# Patient Record
Sex: Male | Born: 1995 | Race: Black or African American | Hispanic: No | Marital: Single | Smoking: Never smoker
Health system: Southern US, Community
[De-identification: ages and names within clinical notes are randomized; demographics above are authoritative.]

---

## 2017-09-23 ENCOUNTER — Ambulatory Visit (HOSPITAL_COMMUNITY)
Admission: EM | Admit: 2017-09-23 | Discharge: 2017-09-23 | Disposition: A | Discharge status: Home / Self Care | Payer: Self-pay | Attending: Family Medicine | Admitting: Family Medicine

## 2017-09-23 ENCOUNTER — Ambulatory Visit (HOSPITAL_COMMUNITY)
Admission: EM | Admit: 2017-09-23 | Discharge: 2017-09-23 | Disposition: A | Discharge status: Other Acute Inpatient Hospital | Payer: Self-pay | Attending: Family Medicine | Admitting: Family Medicine

## 2017-09-23 ENCOUNTER — Encounter (HOSPITAL_COMMUNITY): Payer: Self-pay | Admitting: Emergency Medicine

## 2017-09-23 DIAGNOSIS — N509 Disorder of male genital organs, unspecified: Secondary | ICD-10-CM

## 2017-09-23 DIAGNOSIS — N5089 Other specified disorders of the male genital organs: Secondary | ICD-10-CM

## 2017-09-23 NOTE — ED Triage Notes (Signed)
Pt states "I found a lump on my testicle and I dont know what it is". Denies pain.

## 2017-09-23 NOTE — ED Provider Notes (Signed)
[facility]    CSN: [id number] Arrival date & time: 09/23/17  1916     History   Chief Complaint Chief Complaint  Patient presents with  . Mass  . Testicular Mass    HPI Danny Moore is a 22 y.o. male.   22 year old male, presenting today complaining of a lump on his right testicle.  States that he noticed that today for the first time.  He denies any associated pain.  He denies any dysuria or hematuria.  Denies any penile discharge or lesions.   The history is provided by the patient.  Testicle Pain  This is a new problem. The current episode started 1 to 2 hours ago. The problem occurs constantly. The problem has not changed since onset.Pertinent negatives include no chest pain, no abdominal pain, no headaches and no shortness of breath. Nothing aggravates the symptoms. Nothing relieves the symptoms. He has tried nothing for the symptoms. The treatment provided no relief.    History reviewed. No pertinent past medical history.  There are no active problems to display for this patient.   History reviewed. No pertinent surgical history.     Home Medications    Prior to Admission medications   Not on File    Family History No family history on file.  Social History Social History   Tobacco Use  . Smoking status: Not on file  Substance Use Topics  . Alcohol use: Not on file  . Drug use: Not on file     Allergies   Patient has no known allergies.   Review of Systems Review of Systems  Constitutional: Negative for chills and fever.  HENT: Negative for ear pain and sore throat.   Eyes: Negative for pain and visual disturbance.  Respiratory: Negative for cough and shortness of breath.   Cardiovascular: Negative for chest pain and palpitations.  Gastrointestinal: Negative for abdominal pain and vomiting.  Genitourinary: Positive for scrotal swelling. Negative for dysuria, hematuria and testicular pain.  Musculoskeletal: Negative for  arthralgias and back pain.  Skin: Negative for color change and rash.  Neurological: Negative for seizures, syncope and headaches.  All other systems reviewed and are negative.    Physical Exam Triage Vital Signs ED Triage Vitals [09/23/17 2011]  Enc Vitals Group     BP 126/83     Pulse Rate 83     Resp 18     Temp 98.2 F (36.8 C)     Temp src      SpO2 100 %     Weight      Height      Head Circumference      Peak Flow      Pain Score      Pain Loc      Pain Edu?      Excl. in GC?    No data found.  Updated Vital Signs BP 126/83   Pulse 83   Temp 98.2 F (36.8 C)   Resp 18   SpO2 100%   Visual Acuity Right Eye Distance:   Left Eye Distance:   Bilateral Distance:    Right Eye Near:   Left Eye Near:    Bilateral Near:     Physical Exam  Constitutional: He appears well-developed and well-nourished.  HENT:  Head: Normocephalic and atraumatic.  Eyes: Conjunctivae are normal.  Neck: Neck supple.  Cardiovascular: Normal rate and regular rhythm.  No murmur heard. Pulmonary/Chest: Effort normal and breath sounds normal. No respiratory distress.  Abdominal: Soft. There is no tenderness.  Genitourinary: Right testis shows mass.  Genitourinary Comments: Small round firm mass noted superior to the right testicle in the location of the epididymis.  There is no erythema or edema of the scrotum.  Musculoskeletal: He exhibits no edema.  Neurological: He is alert.  Skin: Skin is warm and dry.  Psychiatric: He has a normal mood and affect.  Nursing note and vitals reviewed.    UC Treatments / Results  Labs (all labs ordered are listed, but only abnormal results are displayed) Labs Reviewed - No data to display  EKG  EKG Interpretation None       Radiology No results found.  Procedures Procedures (including critical care time)  Medications Ordered in UC Medications - No data to display   Initial Impression / Assessment and Plan / UC Course  I  have reviewed the triage vital signs and the nursing notes.  Pertinent labs & imaging results that were available during my care of the patient were reviewed by me and considered in my medical decision making (see chart for details).     Patient palpated a mass on his right testicle today.  Exam seems consistent with the epididymal head.  However, patient has never noticed this in the past.  Will refer to urology for ultrasound and ongoing management and evaluate Do not suspect epididymitis. He has no pain Final Clinical Impressions(s) / UC Diagnoses   Final diagnoses:  Testicular mass    ED Discharge Orders    None       Controlled Substance Prescriptions [place] Controlled Substance Registry consulted? Not Applicable   Alecia Lemming, [place] 09/23/17 2021

## 2019-09-26 ENCOUNTER — Encounter: Payer: Self-pay | Admitting: Adult Health

## 2019-09-26 ENCOUNTER — Ambulatory Visit (INDEPENDENT_AMBULATORY_CARE_PROVIDER_SITE_OTHER): Admitting: Adult Health

## 2019-09-26 ENCOUNTER — Other Ambulatory Visit: Payer: Self-pay

## 2019-09-26 ENCOUNTER — Encounter (INDEPENDENT_AMBULATORY_CARE_PROVIDER_SITE_OTHER): Payer: Self-pay

## 2019-09-26 VITALS — BP 136/78 | HR 85 | Ht 78.0 in | Wt 220.0 lb

## 2019-09-26 DIAGNOSIS — F909 Attention-deficit hyperactivity disorder, unspecified type: Secondary | ICD-10-CM | POA: Insufficient documentation

## 2019-09-26 DIAGNOSIS — F902 Attention-deficit hyperactivity disorder, combined type: Secondary | ICD-10-CM

## 2019-09-26 MED ORDER — AMPHETAMINE-DEXTROAMPHET ER 30 MG PO CP24: 30.0000 mg | ORAL_CAPSULE | Freq: Every day | ORAL | 0 refills | Status: DC

## 2019-09-26 NOTE — Progress Notes (Signed)
[facility] MD/PA/NP Initial Note  09/26/2019 4:30 PM Danny Moore  MRN:  [id number]  Chief Complaint:  Chief Complaint    ADHD      HPI:   Describes mood today as "ok". Pleasant. Mood symptoms - denies depression, anxiety, and irritability. Stating "I'm doing pretty good". History of ADHD - has been off of medication for two years and feels like he needs to restart it. Stable interest and motivation. Taking medications as prescribed.  Energy levels stable. Active, does not have a regular exercise routine. Plays Basketball. Works part-time - Visual merchandiser.  Enjoys some usual interests and activities. Single. Dating. Parents live in [place]. Spending time with friends. Appetite adequate. Weight gain - "a little".  Sleeps well most nights. Averages 6 to 7 hours. Sleep schedule "off" lately. Staying up late doing homework.  Focus and concentration difficulties. Previously diagnosed with ADHD in elementary school. Took medication up until 2 years ago . Junior at [employer]. Completing tasks. Managing aspects of household. Difficulties in school setting.  Denies SI or HI. Denies AH or VH.  Previous medication trials: Adderall XR 30mg   Visit Diagnosis:    ICD-10-CM   1. Attention deficit hyperactivity disorder (ADHD), combined type  F90.2 amphetamine-dextroamphetamine (ADDERALL XR) 30 MG 24 hr capsule    Past Psychiatric History: Denies psychiatric hospitalization.  Past Medical History: No past medical history on file. No past surgical history on file.  Family Psychiatric History: Denies any family history of mental illness.  Family History: No family history on file.  Social History:  Social History   Socioeconomic History  . Marital status: Single    Spouse name: Not on file  . Number of children: Not on file  . Years of education: Not on file  . Highest education level: Not on file  Occupational History  . Not on file  Tobacco Use  . Smoking status: Never Smoker  . Smokeless  tobacco: Never Used  Substance and Sexual Activity  . Alcohol use: Yes    Comment: Occasionally  . Drug use: Never  . Sexual activity: Not on file  Other Topics Concern  . Not on file  Social History Narrative  . Not on file   Social Determinants of Health   Financial Resource Strain:   . Difficulty of Paying Living Expenses: Not on file  Food Insecurity:   . Worried About Charity fundraiser in the Last Year: Not on file  . Ran Out of Food in the Last Year: Not on file  Transportation Needs:   . Lack of Transportation (Medical): Not on file  . Lack of Transportation (Non-Medical): Not on file  Physical Activity:   . Days of Exercise per Week: Not on file  . Minutes of Exercise per Session: Not on file  Stress:   . Feeling of Stress : Not on file  Social Connections:   . Frequency of Communication with Friends and Family: Not on file  . Frequency of Social Gatherings with Friends and Family: Not on file  . Attends Religious Services: Not on file  . Active Member of Clubs or Organizations: Not on file  . Attends Archivist Meetings: Not on file  . Marital Status: Not on file    Allergies: No Known Allergies  Metabolic Disorder Labs: No results found for: HGBA1C, MPG No results found for: PROLACTIN No results found for: CHOL, TRIG, HDL, CHOLHDL, VLDL, LDLCALC No results found for: TSH  Therapeutic Level Labs: No results found for:  LITHIUM No results found for: VALPROATE No components found for:  CBMZ  Current Medications: Current Outpatient Medications  Medication Sig Dispense Refill  . amphetamine-dextroamphetamine (ADDERALL XR) 30 MG 24 hr capsule Take 1 capsule (30 mg total) by mouth daily. 30 capsule 0   No current facility-administered medications for this visit.    Medication Side Effects: none  Orders placed this visit:  No orders of the defined types were placed in this encounter.   Psychiatric Specialty Exam:  Review of Systems  Blood  pressure 136/78, pulse 85, height 6\' 6"  (1.981 m), weight 220 lb (99.8 kg).Body mass index is 25.42 kg/m.  General Appearance: Neat and Well Groomed  Eye Contact:  Good  Speech:  Clear and Coherent and Normal Rate  Volume:  Normal  Mood:  Euthymic  Affect:  Appropriate and Congruent  Thought Process:  Coherent and Descriptions of Associations: Intact  Orientation:  Full (Time, Place, and Person)  Thought Content: Logical   Suicidal Thoughts:  No  Homicidal Thoughts:  No  Memory:  WNL  Judgement:  Good  Insight:  Good  Psychomotor Activity:  Normal  Concentration:  Concentration: Good  Recall:  Good  Fund of Knowledge: Good  Language: Good  Assets:  Communication Skills Desire for Improvement Financial Resources/Insurance Housing Intimacy Leisure Time Physical Health Resilience Social Support Talents/Skills Transportation Vocational/Educational  ADL's:  Intact  Cognition: WNL  Prognosis:  Good   Screenings: None  Receiving Psychotherapy: No   Treatment Plan/Recommendations:   Plan:  PDMP reviewed  1. Add Adderall XR 30mg  daily - previous dose  RTC 4 weeks  Patient advised to contact office with any questions, adverse effects, or acute worsening in signs and symptoms.  Discussed potential benefits, risks, and side effects of stimulants with patient to include increased heart rate, palpitations, insomnia, increased anxiety, increased irritability, or decreased appetite.  Instructed patient to contact office if experiencing any significant tolerability issues.      , NP

## 2019-10-24 ENCOUNTER — Ambulatory Visit (INDEPENDENT_AMBULATORY_CARE_PROVIDER_SITE_OTHER): Admitting: Adult Health

## 2019-10-24 ENCOUNTER — Other Ambulatory Visit: Payer: Self-pay

## 2019-10-24 ENCOUNTER — Encounter: Payer: Self-pay | Admitting: Adult Health

## 2019-10-24 DIAGNOSIS — F902 Attention-deficit hyperactivity disorder, combined type: Secondary | ICD-10-CM | POA: Diagnosis not present

## 2019-10-24 MED ORDER — AMPHETAMINE-DEXTROAMPHET ER 30 MG PO CP24: 30.0000 mg | ORAL_CAPSULE | Freq: Two times a day (BID) | ORAL | 0 refills | Status: DC

## 2019-10-24 NOTE — Progress Notes (Signed)
Danny Moore [id number] 04-30-1996 24 y.o.  Subjective:   Patient ID:  Danny Moore is a 24 y.o. (DOB [birth year]) male.  Chief Complaint: No chief complaint on file.   HPI Danny Moore presents to the office today for follow-up of ADHD.  Describes mood today as "ok". Pleasant. Mood symptoms - denies depression, anxiety, and irritability. Stating "I'm doing alright". Feels like restarting Adderall has been helpful. Getting caught up on classwork. Stating "I feel better about that". Would like to increase dose - "not lasting throughout the day". Stable interest and motivation. Taking medications as prescribed.  Energy levels stable. Active, has a regular exercise routine. Plays Basketball. Works part-time - Visual merchandiser.  Enjoys some usual interests and activities. Single. Dating. Parents live in [place]. Spending time with friends. Appetite adequate. Weight stable.   Sleeps well most nights. Averages 6 to 7 hours - "getting back to normal". Focus and concentration difficulties. Previously diagnosed with ADHD in elementary school. Took medication up until 2 years ago . Junior at [employer]. Completing tasks. Managing aspects of household. Doing better in school setting.  Denies SI or HI. Denies AH or VH.  Previous medication trials: Adderall XR 30mg   Review of Systems:  Review of Systems  Musculoskeletal: Negative for gait problem.  Neurological: Negative for tremors.  Psychiatric/Behavioral:       Please refer to HPI    Medications: I have reviewed the patient's current medications.  Current Outpatient Medications  Medication Sig Dispense Refill  . amphetamine-dextroamphetamine (ADDERALL XR) 30 MG 24 hr capsule Take 1 capsule (30 mg total) by mouth daily. 30 capsule 0   No current facility-administered medications for this visit.    Medication Side Effects: None  Allergies: No Known Allergies  No past medical history on file.  No family history on file.  Social History    Socioeconomic History  . Marital status: Single    Spouse name: Not on file  . Number of children: Not on file  . Years of education: Not on file  . Highest education level: Not on file  Occupational History  . Not on file  Tobacco Use  . Smoking status: Never Smoker  . Smokeless tobacco: Never Used  Substance and Sexual Activity  . Alcohol use: Yes    Comment: Occasionally  . Drug use: Never  . Sexual activity: Not on file  Other Topics Concern  . Not on file  Social History Narrative  . Not on file   Social Determinants of Health   Financial Resource Strain:   . Difficulty of Paying Living Expenses: Not on file  Food Insecurity:   . Worried About Charity fundraiser in the Last Year: Not on file  . Ran Out of Food in the Last Year: Not on file  Transportation Needs:   . Lack of Transportation (Medical): Not on file  . Lack of Transportation (Non-Medical): Not on file  Physical Activity:   . Days of Exercise per Week: Not on file  . Minutes of Exercise per Session: Not on file  Stress:   . Feeling of Stress : Not on file  Social Connections:   . Frequency of Communication with Friends and Family: Not on file  . Frequency of Social Gatherings with Friends and Family: Not on file  . Attends Religious Services: Not on file  . Active Member of Clubs or Organizations: Not on file  . Attends Archivist Meetings: Not on file  . Marital Status: Not on  file  Intimate Partner Violence:   . Fear of Current or Ex-Partner: Not on file  . Emotionally Abused: Not on file  . Physically Abused: Not on file  . Sexually Abused: Not on file    Past Medical History, Surgical history, Social history, and Family history were reviewed and updated as appropriate.   Please see review of systems for further details on the patient's review from today.   Objective:   Physical Exam:  There were no vitals taken for this visit.  Physical Exam Constitutional:      General:  He is not in acute distress. Musculoskeletal:        General: No deformity.  Neurological:     Mental Status: He is alert and oriented to person, place, and time.     Coordination: Coordination normal.  Psychiatric:        Attention and Perception: Attention and perception normal. He does not perceive auditory or visual hallucinations.        Mood and Affect: Mood normal. Mood is not anxious or depressed. Affect is not labile, blunt, angry or inappropriate.        Speech: Speech normal.        Behavior: Behavior normal.        Thought Content: Thought content normal. Thought content is not paranoid or delusional. Thought content does not include homicidal or suicidal ideation. Thought content does not include homicidal or suicidal plan.        Cognition and Memory: Cognition and memory normal.        Judgment: Judgment normal.     Comments: Insight intact     Lab Review:  No results found for: NA, K, CL, CO2, GLUCOSE, BUN, CREATININE, CALCIUM, PROT, ALBUMIN, AST, ALT, ALKPHOS, BILITOT, GFRNONAA, GFRAA  No results found for: WBC, RBC, HGB, HCT, PLT, MCV, MCH, MCHC, RDW, LYMPHSABS, MONOABS, EOSABS, BASOSABS  No results found for: POCLITH, LITHIUM   No results found for: PHENYTOIN, PHENOBARB, VALPROATE, CBMZ   .res Assessment: Plan:    Plan:  PDMP reviewed  1. Increase Adderall XR 30mg  daily to BID   RTC 4 weeks  Patient advised to contact office with any questions, adverse effects, or acute worsening in signs and symptoms.  Discussed potential benefits, risks, and side effects of stimulants with patient to include increased heart rate, palpitations, insomnia, increased anxiety, increased irritability, or decreased appetite.  Instructed patient to contact office if experiencing any significant tolerability issues.    There are no diagnoses linked to this encounter.   Please see After Visit Summary for patient specific instructions.  No future appointments.  No orders of  the defined types were placed in this encounter.   -------------------------------

## 2019-11-21 ENCOUNTER — Ambulatory Visit: Admitting: Adult Health

## 2019-11-28 ENCOUNTER — Emergency Department (HOSPITAL_COMMUNITY): Payer: No Typology Code available for payment source

## 2019-11-28 ENCOUNTER — Encounter (HOSPITAL_COMMUNITY): Payer: Self-pay | Admitting: *Deleted

## 2019-11-28 ENCOUNTER — Emergency Department (HOSPITAL_COMMUNITY)
Admission: EM | Admit: 2019-11-28 | Discharge: 2019-11-28 | Disposition: A | Discharge status: Home / Self Care | Payer: No Typology Code available for payment source | Attending: Emergency Medicine | Admitting: Emergency Medicine

## 2019-11-28 ENCOUNTER — Other Ambulatory Visit: Payer: Self-pay

## 2019-11-28 DIAGNOSIS — Y92410 Unspecified street and highway as the place of occurrence of the external cause: Secondary | ICD-10-CM | POA: Diagnosis not present

## 2019-11-28 DIAGNOSIS — S4991XA Unspecified injury of right shoulder and upper arm, initial encounter: Secondary | ICD-10-CM

## 2019-11-28 DIAGNOSIS — Z79899 Other long term (current) drug therapy: Secondary | ICD-10-CM | POA: Diagnosis not present

## 2019-11-28 DIAGNOSIS — S40911A Unspecified superficial injury of right shoulder, initial encounter: Secondary | ICD-10-CM | POA: Diagnosis present

## 2019-11-28 DIAGNOSIS — Y9389 Activity, other specified: Secondary | ICD-10-CM | POA: Insufficient documentation

## 2019-11-28 DIAGNOSIS — Y999 Unspecified external cause status: Secondary | ICD-10-CM | POA: Diagnosis not present

## 2019-11-28 DIAGNOSIS — M25511 Pain in right shoulder: Secondary | ICD-10-CM | POA: Diagnosis not present

## 2019-11-28 DIAGNOSIS — R52 Pain, unspecified: Secondary | ICD-10-CM

## 2019-11-28 MED ORDER — CYCLOBENZAPRINE HCL 10 MG PO TABS: 10.0000 mg | ORAL_TABLET | Freq: Two times a day (BID) | ORAL | 0 refills | Status: AC | PRN
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
## 2019-11-30 ENCOUNTER — Ambulatory Visit: Admitting: Adult Health

## 2019-12-18 ENCOUNTER — Ambulatory Visit: Admitting: Adult Health

## 2020-01-08 ENCOUNTER — Ambulatory Visit: Admitting: Adult Health

## 2020-01-23 ENCOUNTER — Ambulatory Visit: Admitting: Adult Health

## 2020-03-12 ENCOUNTER — Ambulatory Visit: Admitting: Adult Health

## 2020-03-13 ENCOUNTER — Other Ambulatory Visit: Payer: Self-pay

## 2020-03-13 ENCOUNTER — Ambulatory Visit (INDEPENDENT_AMBULATORY_CARE_PROVIDER_SITE_OTHER): Admitting: Adult Health

## 2020-03-13 ENCOUNTER — Encounter: Payer: Self-pay | Admitting: Adult Health

## 2020-03-13 DIAGNOSIS — F902 Attention-deficit hyperactivity disorder, combined type: Secondary | ICD-10-CM | POA: Diagnosis not present

## 2020-03-13 MED ORDER — AMPHETAMINE-DEXTROAMPHET ER 30 MG PO CP24: 30.0000 mg | ORAL_CAPSULE | Freq: Two times a day (BID) | ORAL | 0 refills | Status: AC

## 2020-03-13 NOTE — Progress Notes (Signed)
Danny Moore [id number] 1995/11/26 24 y.o.  Subjective:   Patient ID:  Danny Moore is a 24 y.o. (DOB 09/18/1995) male.  Chief Complaint: No chief complaint on file.   HPI [name] presents to the office today for follow-up of ADHD.  Describes mood today as "ok". Pleasant. Mood symptoms - denies depression, anxiety, and irritability. Stating "I'm doing ok - having some family stressors. Had to drop 2 of his 4 summer school classes. Returning full time in August. Feels like Adderall continues to wor well for him. Stable interest and motivation. Taking medications as prescribed.  Energy levels stable. Active, has a regular exercise routine.   Enjoys some usual interests and activities. Single. Dating. Parents live in [place]. Spending time with friends. Appetite adequate. Weight stable.   Sleeps well most nights. Averages 6 to 7 hours. Focus and concentration difficulties. Previously diagnosed with ADHD in elementary school. Senior at [employer]. Completing tasks. Managing aspects of household. Doing well with classes. Works part-time - Research scientist (physical sciences).  Denies SI or HI. Denies AH or VH.  Previous medication trials: Adderall XR 30mg    Review of Systems:  Review of Systems  Musculoskeletal: Negative for gait problem.  Neurological: Negative for tremors.  Psychiatric/Behavioral:       Please refer to HPI    Medications: I have reviewed the patient's current medications.  Current Outpatient Medications  Medication Sig Dispense Refill  . amphetamine-dextroamphetamine (ADDERALL XR) 30 MG 24 hr capsule Take 1 capsule (30 mg total) by mouth 2 (two) times daily. 60 capsule 0  . cyclobenzaprine (FLEXERIL) 10 MG tablet Take 1 tablet (10 mg total) by mouth 2 (two) times daily as needed for muscle spasms. 20 tablet 0   No current facility-administered medications for this visit.    Medication Side Effects: None  Allergies: No Known Allergies  No past medical history on file.  No  family history on file.  Social History   Socioeconomic History  . Marital status: Single    Spouse name: Not on file  . Number of children: Not on file  . Years of education: Not on file  . Highest education level: Not on file  Occupational History  . Not on file  Tobacco Use  . Smoking status: Never Smoker  . Smokeless tobacco: Never Used  Substance and Sexual Activity  . Alcohol use: Yes    Comment: Occasionally  . Drug use: Never  . Sexual activity: Not on file  Other Topics Concern  . Not on file  Social History Narrative  . Not on file   Social Determinants of Health   Financial Resource Strain:   . Difficulty of Paying Living Expenses:   Food Insecurity:   . Worried About in the Last Year:   . Programme researcher, broadcasting/film/video in the Last Year:   Transportation Needs:   . Barista (Medical):   Freight forwarder Lack of Transportation (Non-Medical):   Physical Activity:   . Days of Exercise per Week:   . Minutes of Exercise per Session:   Stress:   . Feeling of Stress :   Social Connections:   . Frequency of Communication with Friends and Family:   . Frequency of Social Gatherings with Friends and Family:   . Attends Religious Services:   . Active Member of Clubs or Organizations:   . Attends [employer] Meetings:   Banker Marital Status:   Intimate Partner Violence:   . Fear of Current or  Ex-Partner:   . Emotionally Abused:   [employer] Physically Abused:   . Sexually Abused:     Past Medical History, Surgical history, Social history, and Family history were reviewed and updated as appropriate.   Please see review of systems for further details on the patient's review from today.   Objective:   Physical Exam:  There were no vitals taken for this visit.  Physical Exam Constitutional:      General: He is not in acute distress. Musculoskeletal:        General: No deformity.  Neurological:     Mental Status: He is alert and oriented to person, place,  and time.     Coordination: Coordination normal.  Psychiatric:        Attention and Perception: Attention and perception normal. He does not perceive auditory or visual hallucinations.        Mood and Affect: Mood normal. Mood is not anxious or depressed. Affect is not labile, blunt, angry or inappropriate.        Speech: Speech normal.        Behavior: Behavior normal.        Thought Content: Thought content normal. Thought content is not paranoid or delusional. Thought content does not include homicidal or suicidal ideation. Thought content does not include homicidal or suicidal plan.        Cognition and Memory: Cognition and memory normal.        Judgment: Judgment normal.     Comments: Insight intact     Lab Review:  No results found for: NA, K, CL, CO2, GLUCOSE, BUN, CREATININE, CALCIUM, PROT, ALBUMIN, AST, ALT, ALKPHOS, BILITOT, GFRNONAA, GFRAA  No results found for: WBC, RBC, HGB, HCT, PLT, MCV, MCH, MCHC, RDW, LYMPHSABS, MONOABS, EOSABS, BASOSABS  No results found for: POCLITH, LITHIUM   No results found for: PHENYTOIN, PHENOBARB, VALPROATE, CBMZ   .res Assessment: Plan:    Plan:  PDMP reviewed  1. Continue Adderall XR 30mg  BID   RTC 4 months  Patient advised to contact office with any questions, adverse effects, or acute worsening in signs and symptoms.  Discussed potential benefits, risks, and side effects of stimulants with patient to include increased heart rate, palpitations, insomnia, increased anxiety, increased irritability, or decreased appetite.  Instructed patient to contact office if experiencing any significant tolerability issues.     Diagnoses and all orders for this visit:  Attention deficit hyperactivity disorder (ADHD), combined type     Please see After Visit Summary for patient specific instructions.  Future Appointments  Date Time Provider Department Center  07/03/2020 12:00 PM Anandi Abramo, 07/05/2020, NP [facility] None    No orders of  the defined types were placed in this encounter.   -------------------------------

## 2020-06-27 ENCOUNTER — Emergency Department (HOSPITAL_COMMUNITY)
Admission: EM | Admit: 2020-06-27 | Discharge: 2020-06-27 | Payer: Self-pay | Attending: Emergency Medicine | Admitting: Emergency Medicine

## 2020-06-27 ENCOUNTER — Emergency Department (HOSPITAL_COMMUNITY): Payer: Self-pay

## 2020-06-27 ENCOUNTER — Encounter (HOSPITAL_COMMUNITY): Payer: Self-pay

## 2020-06-27 DIAGNOSIS — S8262XA Displaced fracture of lateral malleolus of left fibula, initial encounter for closed fracture: Secondary | ICD-10-CM | POA: Diagnosis not present

## 2020-06-27 DIAGNOSIS — W098XXA Fall on or from other playground equipment, initial encounter: Secondary | ICD-10-CM | POA: Diagnosis not present

## 2020-06-27 DIAGNOSIS — Y30XXXA Falling, jumping or pushed from a high place, undetermined intent, initial encounter: Secondary | ICD-10-CM | POA: Diagnosis not present

## 2020-06-27 DIAGNOSIS — S8992XA Unspecified injury of left lower leg, initial encounter: Secondary | ICD-10-CM | POA: Diagnosis present

## 2020-06-27 DIAGNOSIS — W19XXXA Unspecified fall, initial encounter: Secondary | ICD-10-CM

## 2020-06-27 MED ORDER — NAPROXEN 250 MG PO TABS
500.0000 mg | ORAL_TABLET | Freq: Once | ORAL | Status: AC
  Administered 2020-06-27: 500 mg via ORAL
  Filled 2020-06-27: qty 2
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
## 2020-06-28 NOTE — ED Provider Notes (Signed)
[facility] EMERGENCY DEPARTMENT Provider Note   CSN: [id number] Arrival date & time: 06/27/20  2056     History Chief Complaint  Patient presents with  . Ankle Pain    Danny Moore is a 24 y.o. male.  24 year old male presenting for bilateral lower extremity pain, onset this evening. Pain has been constant and unchanged. Is aggravated with weightbearing. Sustained pain after jumping approximately 8 feet into a dry creek bed. Denies any numbness, paresthesias. No medications taken prior to arrival. He is not followed by an orthopedic doctor. Presently rates pain at 7/10.  The history is provided by the patient. No language interpreter was used.  Ankle Pain      History reviewed. No pertinent past medical history.  Patient Active Problem List   Diagnosis Date Noted  . Attention deficit hyperactivity disorder (ADHD) 09/26/2019    History reviewed. No pertinent surgical history.     No family history on file.  Social History   Tobacco Use  . Smoking status: Never Smoker  . Smokeless tobacco: Never Used  Substance Use Topics  . Alcohol use: Yes    Comment: Occasionally  . Drug use: Never    Home Medications Prior to Admission medications   Medication Sig Start Date End Date Taking? Authorizing Provider  amphetamine-dextroamphetamine (ADDERALL XR) 30 MG 24 hr capsule Take 1 capsule (30 mg total) by mouth 2 (two) times daily. 03/13/20   Mozingo, Thereasa Solo, NP  amphetamine-dextroamphetamine (ADDERALL XR) 30 MG 24 hr capsule Take 1 capsule (30 mg total) by mouth 2 (two) times daily. 04/10/20   Mozingo, Thereasa Solo, NP  amphetamine-dextroamphetamine (ADDERALL XR) 30 MG 24 hr capsule Take 1 capsule (30 mg total) by mouth 2 (two) times daily. 05/08/20   Mozingo, Thereasa Solo, NP  cyclobenzaprine (FLEXERIL) 10 MG tablet Take 1 tablet (10 mg total) by mouth 2 (two) times daily as needed for muscle spasms. 11/28/19   Tegeler, Canary Brim, MD     Allergies    Patient has no known allergies.  Review of Systems   Review of Systems  Ten systems reviewed and are negative for acute change, except as noted in the HPI.    Physical Exam Updated Vital Signs BP 132/66   Pulse 82   Temp 98.4 F (36.9 C) (Oral)   Resp 16   SpO2 98%   Physical Exam Vitals and nursing note reviewed.  Constitutional:      General: He is not in acute distress.    Appearance: He is well-developed. He is not diaphoretic.     Comments: Nontoxic appearing and in NAD  HENT:     Head: Normocephalic and atraumatic.  Eyes:     General: No scleral icterus.    Conjunctiva/sclera: Conjunctivae normal.  Cardiovascular:     Rate and Rhythm: Normal rate and regular rhythm.     Pulses: Normal pulses.     Comments: DP pulse 2+ bilaterally Pulmonary:     Effort: Pulmonary effort is normal. No respiratory distress.     Comments: Respirations even and unlabored Musculoskeletal:        General: Normal range of motion.     Cervical back: Normal range of motion.     Right ankle:     Right Achilles Tendon: Normal.     Left ankle: Swelling (mild, lateral malleolus) present. No deformity. Tenderness present over the lateral malleolus.     Left Achilles Tendon: Normal.       Feet:  Skin:  General: Skin is warm and dry.     Coloration: Skin is not pale.     Findings: No erythema or rash.     Comments: Multiple abrasions to BLE including the left knee, lateral malleolus, right shin, R foot.  Neurological:     Mental Status: He is alert and oriented to person, place, and time.     Coordination: Coordination normal.     Comments: Sensation to light touch intact in bilateral lower extremities. Able to wiggle toes.  Psychiatric:        Behavior: Behavior normal.     ED Results / Procedures / Treatments   Labs (all labs ordered are listed, but only abnormal results are displayed) Labs Reviewed - No data to display  EKG None  Radiology DG Ankle  Complete Left  Result Date: 06/27/2020 CLINICAL DATA:  Bilateral foot and ankle pain, trauma EXAM: LEFT FOOT - COMPLETE 3+ VIEW; RIGHT FOOT COMPLETE - 3+ VIEW; LEFT ANKLE COMPLETE - 3+ VIEW; RIGHT ANKLE - COMPLETE 3+ VIEW COMPARISON:  None. FINDINGS: Left ankle: Frontal, oblique, lateral views demonstrate a minimally displaced fracture at the tip of the lateral malleolus, with overlying soft tissue swelling. Alignment is anatomic. Joint spaces are well preserved. Left foot: Frontal, oblique, and lateral views demonstrate no fractures. Alignment is anatomic. Joint spaces are well preserved. Right ankle: Frontal, oblique, and lateral views demonstrate no fracture, subluxation, or dislocation. Joint spaces are well preserved. Soft tissues are normal. Right foot: Frontal, oblique, and lateral views demonstrate no fracture, subluxation, or dislocation. Mild osteoarthritis of the midfoot. Soft tissues are unremarkable. IMPRESSION: 1. Minimally displaced left lateral malleolar fracture, with overlying soft tissue swelling. 2. Unremarkable left foot, right ankle, and right foot. Electronically Signed   By: Sharlet Salina M.D.   On: 06/27/2020 21:43   DG Ankle Complete Right  Result Date: 06/27/2020 CLINICAL DATA:  Bilateral foot and ankle pain, trauma EXAM: LEFT FOOT - COMPLETE 3+ VIEW; RIGHT FOOT COMPLETE - 3+ VIEW; LEFT ANKLE COMPLETE - 3+ VIEW; RIGHT ANKLE - COMPLETE 3+ VIEW COMPARISON:  None. FINDINGS: Left ankle: Frontal, oblique, lateral views demonstrate a minimally displaced fracture at the tip of the lateral malleolus, with overlying soft tissue swelling. Alignment is anatomic. Joint spaces are well preserved. Left foot: Frontal, oblique, and lateral views demonstrate no fractures. Alignment is anatomic. Joint spaces are well preserved. Right ankle: Frontal, oblique, and lateral views demonstrate no fracture, subluxation, or dislocation. Joint spaces are well preserved. Soft tissues are normal. Right foot:  Frontal, oblique, and lateral views demonstrate no fracture, subluxation, or dislocation. Mild osteoarthritis of the midfoot. Soft tissues are unremarkable. IMPRESSION: 1. Minimally displaced left lateral malleolar fracture, with overlying soft tissue swelling. 2. Unremarkable left foot, right ankle, and right foot. Electronically Signed   By: Sharlet Salina M.D.   On: 06/27/2020 21:43   DG Knee Complete 4 Views Left  Result Date: 06/27/2020 CLINICAL DATA:  Left knee pain, trauma EXAM: LEFT KNEE - COMPLETE 4+ VIEW COMPARISON:  None. FINDINGS: Frontal, bilateral oblique, and lateral views of the left knee demonstrate no fracture, subluxation, or dislocation. Joint spaces are well preserved. No joint effusion. IMPRESSION: 1. Unremarkable left knee. Electronically Signed   By: Sharlet Salina M.D.   On: 06/27/2020 21:44   DG Foot Complete Left  Result Date: 06/27/2020 CLINICAL DATA:  Bilateral foot and ankle pain, trauma EXAM: LEFT FOOT - COMPLETE 3+ VIEW; RIGHT FOOT COMPLETE - 3+ VIEW; LEFT ANKLE COMPLETE - 3+ VIEW; RIGHT  ANKLE - COMPLETE 3+ VIEW COMPARISON:  None. FINDINGS: Left ankle: Frontal, oblique, lateral views demonstrate a minimally displaced fracture at the tip of the lateral malleolus, with overlying soft tissue swelling. Alignment is anatomic. Joint spaces are well preserved. Left foot: Frontal, oblique, and lateral views demonstrate no fractures. Alignment is anatomic. Joint spaces are well preserved. Right ankle: Frontal, oblique, and lateral views demonstrate no fracture, subluxation, or dislocation. Joint spaces are well preserved. Soft tissues are normal. Right foot: Frontal, oblique, and lateral views demonstrate no fracture, subluxation, or dislocation. Mild osteoarthritis of the midfoot. Soft tissues are unremarkable. IMPRESSION: 1. Minimally displaced left lateral malleolar fracture, with overlying soft tissue swelling. 2. Unremarkable left foot, right ankle, and right foot.  Electronically Signed   By: Sharlet Salina M.D.   On: 06/27/2020 21:43   DG Foot Complete Right  Result Date: 06/27/2020 CLINICAL DATA:  Bilateral foot and ankle pain, trauma EXAM: LEFT FOOT - COMPLETE 3+ VIEW; RIGHT FOOT COMPLETE - 3+ VIEW; LEFT ANKLE COMPLETE - 3+ VIEW; RIGHT ANKLE - COMPLETE 3+ VIEW COMPARISON:  None. FINDINGS: Left ankle: Frontal, oblique, lateral views demonstrate a minimally displaced fracture at the tip of the lateral malleolus, with overlying soft tissue swelling. Alignment is anatomic. Joint spaces are well preserved. Left foot: Frontal, oblique, and lateral views demonstrate no fractures. Alignment is anatomic. Joint spaces are well preserved. Right ankle: Frontal, oblique, and lateral views demonstrate no fracture, subluxation, or dislocation. Joint spaces are well preserved. Soft tissues are normal. Right foot: Frontal, oblique, and lateral views demonstrate no fracture, subluxation, or dislocation. Mild osteoarthritis of the midfoot. Soft tissues are unremarkable. IMPRESSION: 1. Minimally displaced left lateral malleolar fracture, with overlying soft tissue swelling. 2. Unremarkable left foot, right ankle, and right foot. Electronically Signed   By: Sharlet Salina M.D.   On: 06/27/2020 21:43    Procedures Procedures (including critical care time)  Medications Ordered in ED Medications  naproxen (NAPROSYN) tablet 500 mg (500 mg Oral Given 06/27/20 2348)    ED Course  I have reviewed the triage vital signs and the nursing notes.  Pertinent labs & imaging results that were available during my care of the patient were reviewed by me and considered in my medical decision making (see chart for details).    MDM Rules/Calculators/A&P                          24 year old male presents in police custody for pain to his bilateral lower extremities including his bilateral feet and ankles. Jumped approximately 8 feet into a dry creek bed. X-rays ordered in triage which show  minimally displaced left lateral malleoli are fracture. There is associated tenderness and swelling. Patient neurovascularly intact. Placed in a cam walker and given crutches for WBAT. Will refer to orthopedics for follow-up. Encouraged ibuprofen and icing. Return precautions discussed and provided. Patient discharged in [employer] custody in stable condition with no unaddressed concerns.   Final Clinical Impression(s) / ED Diagnoses Final diagnoses:  Fall  Closed displaced fracture of lateral malleolus of left fibula, initial encounter    Rx / DC Orders ED Discharge Orders    None       Antony Madura, PA-C 06/28/20 0049    Ward, Layla Maw, DO 06/28/20 [phone]

## 2020-07-03 ENCOUNTER — Ambulatory Visit: Admitting: Adult Health

## 2022-01-26 IMAGING — CR DG ANKLE COMPLETE 3+V*L*
3 series · 3 of 3 positions shown · non-contrast
Comparison: None.

CLINICAL DATA: Bilateral foot and ankle pain, trauma

EXAM:
LEFT FOOT - COMPLETE 3+ VIEW; RIGHT FOOT COMPLETE - 3+ VIEW; LEFT
ANKLE COMPLETE - 3+ VIEW; RIGHT ANKLE - COMPLETE 3+ VIEW

[ankle ap]
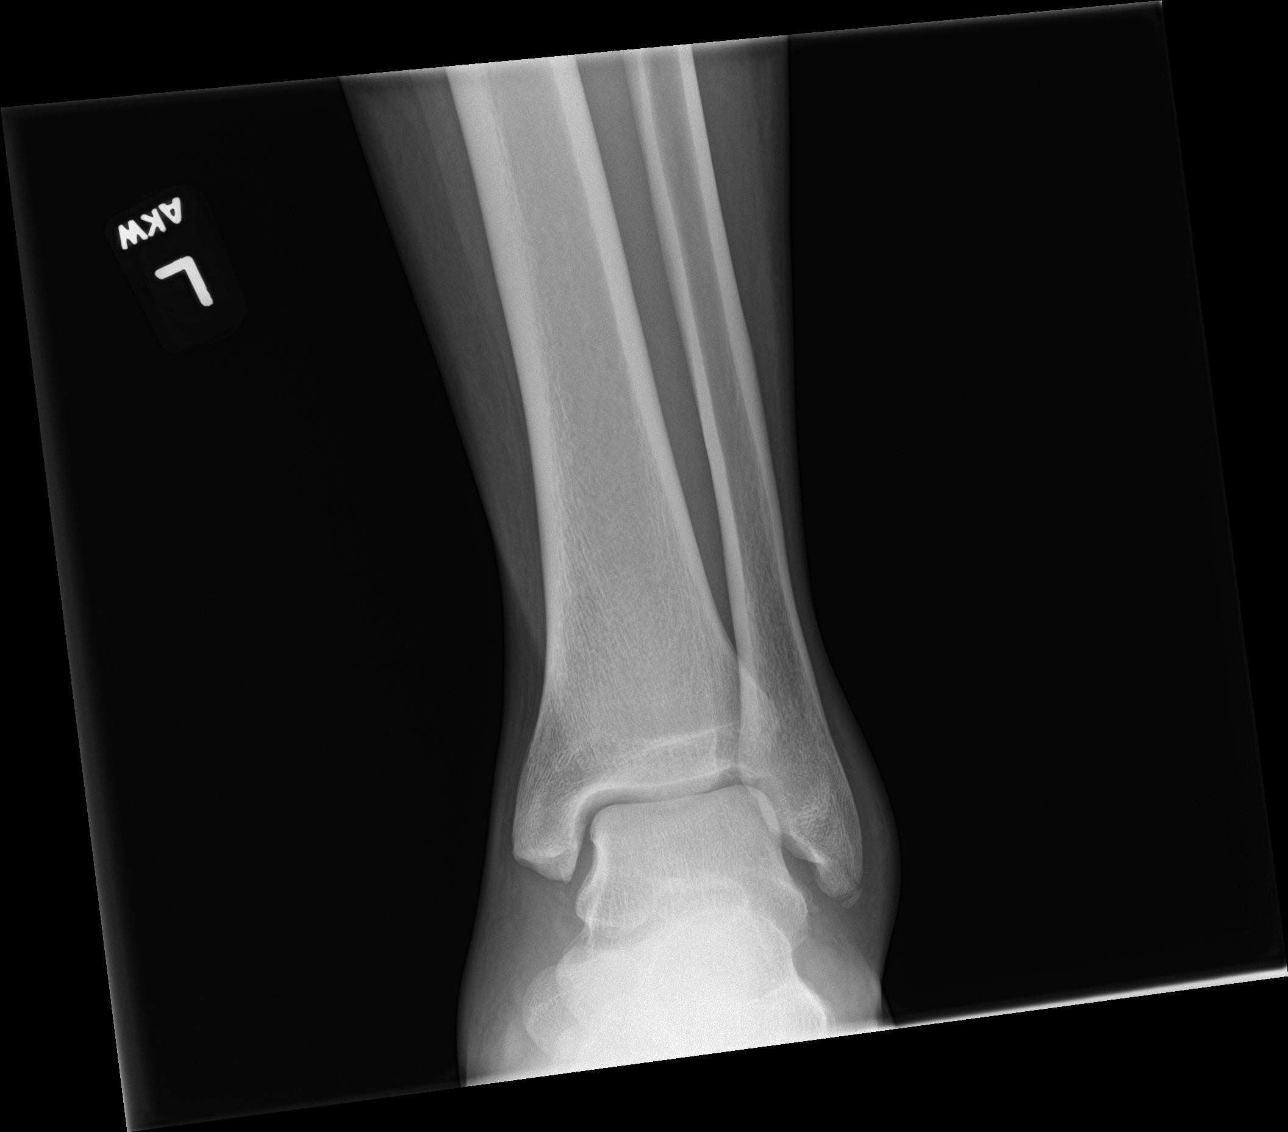

[ankle lat]
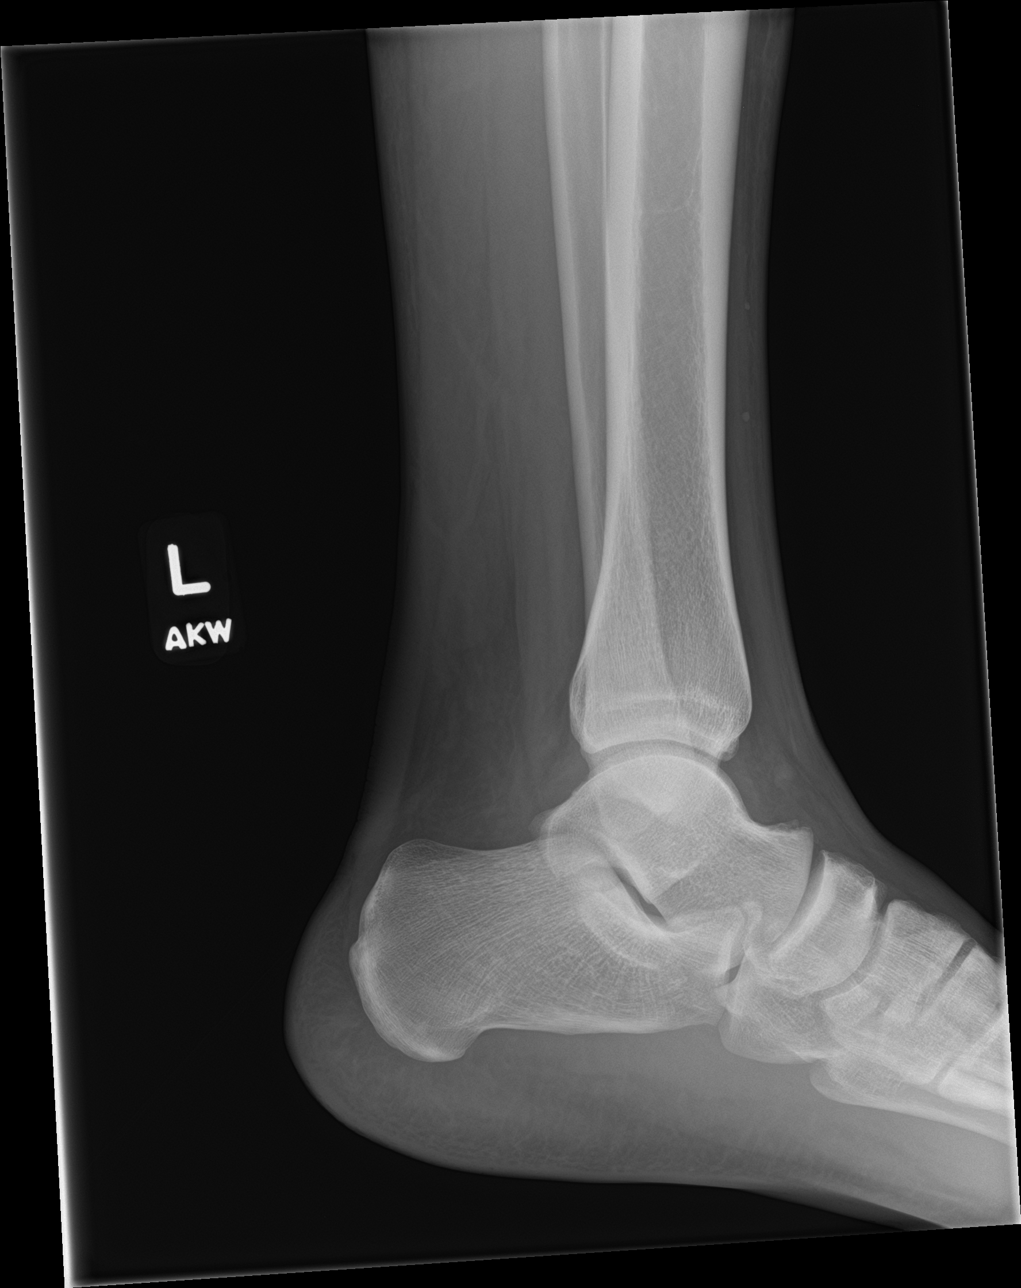

[ankle obl]
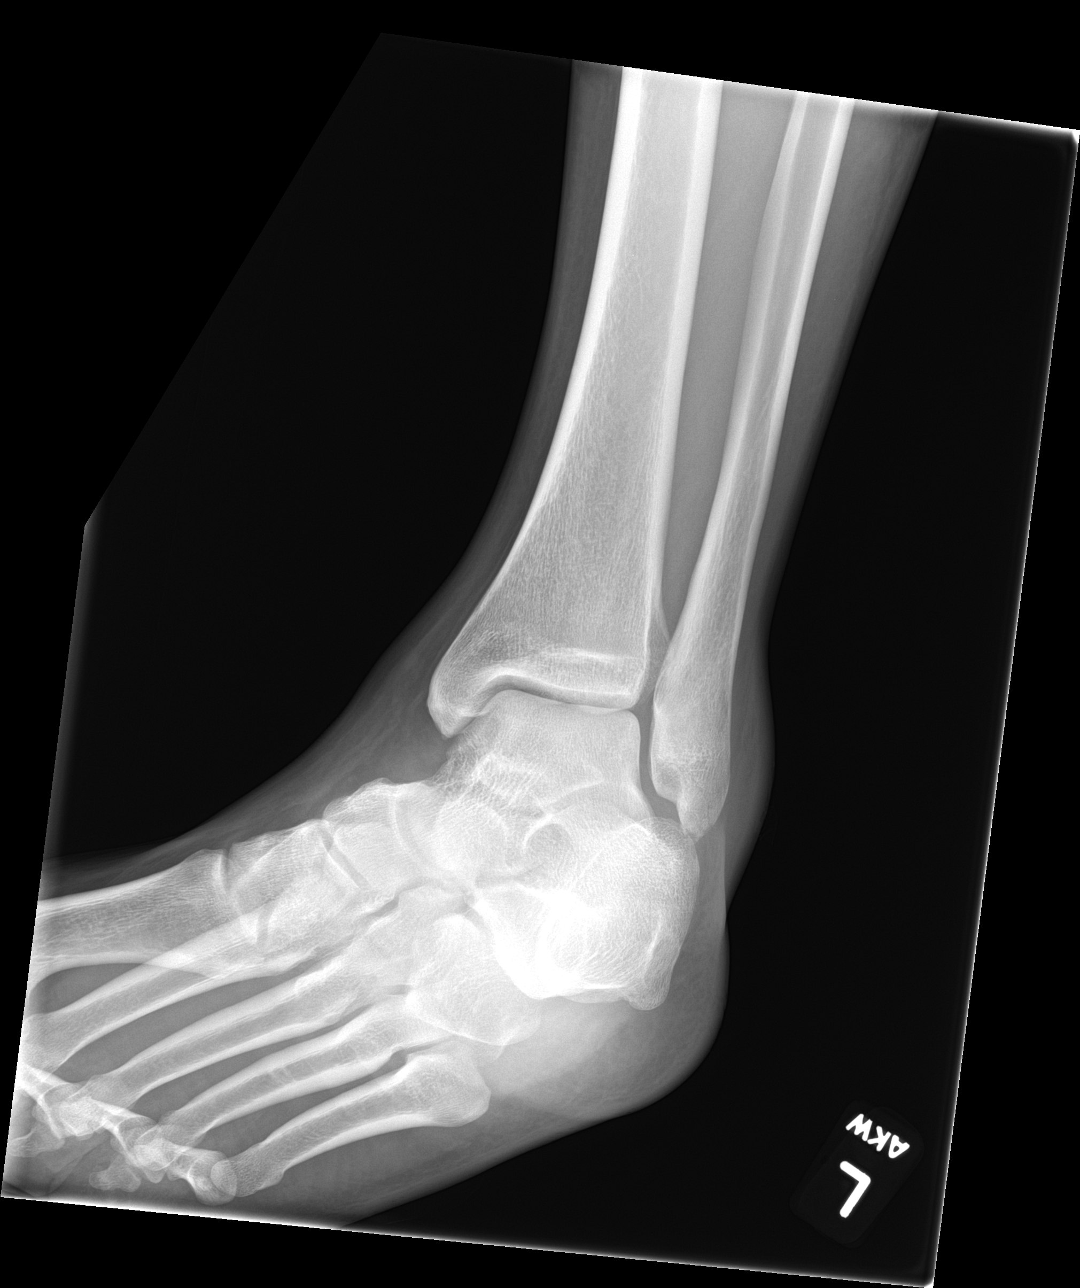

[3 of 3 positions shown; findings below may reference images not displayed]

FINDINGS: Left ankle: Frontal, oblique, lateral views demonstrate a minimally
displaced fracture at the tip of the lateral malleolus, with
overlying soft tissue swelling. Alignment is anatomic. Joint spaces
are well preserved.

Left foot: Frontal, oblique, and lateral views demonstrate no
fractures. Alignment is anatomic. Joint spaces are well preserved.

Right ankle: Frontal, oblique, and lateral views demonstrate no
fracture, subluxation, or dislocation. Joint spaces are well
preserved. Soft tissues are normal.

Right foot: Frontal, oblique, and lateral views demonstrate no
fracture, subluxation, or dislocation. Mild osteoarthritis of the
midfoot. Soft tissues are unremarkable.
IMPRESSION: 1. Minimally displaced left lateral malleolar fracture, with
overlying soft tissue swelling.
2. Unremarkable left foot, right ankle, and right foot.
